# Patient Record
Sex: Female | Born: 1980 | Hispanic: Yes | Marital: Single | Smoking: Never smoker
Health system: Southern US, Community
[De-identification: ages and names within clinical notes are randomized; demographics above are authoritative.]

## PROBLEM LIST (undated history)

## (undated) DIAGNOSIS — E119 Type 2 diabetes mellitus without complications: Secondary | ICD-10-CM

---

## 2011-12-17 ENCOUNTER — Emergency Department: Payer: Self-pay | Admitting: Emergency Medicine

## 2012-02-28 LAB — HM PAP SMEAR: HM Pap smear: NEGATIVE

## 2012-03-31 ENCOUNTER — Emergency Department: Payer: Self-pay | Admitting: Emergency Medicine

## 2012-03-31 LAB — CBC
HGB: 13.6 g/dL (ref 12.0–16.0)
Platelet: 237 10*3/uL (ref 150–440)
RBC: 4.66 10*6/uL (ref 3.80–5.20)
RDW: 13.8 % (ref 11.5–14.5)

## 2012-04-02 ENCOUNTER — Other Ambulatory Visit: Payer: Self-pay | Admitting: Emergency Medicine

## 2012-04-02 LAB — HCG, QUANTITATIVE, PREGNANCY: Beta Hcg, Quant.: 115 m[IU]/mL — ABNORMAL HIGH

## 2012-08-04 ENCOUNTER — Ambulatory Visit: Payer: Self-pay | Admitting: Family Medicine

## 2012-08-21 ENCOUNTER — Emergency Department: Payer: Self-pay | Admitting: Emergency Medicine

## 2012-08-21 LAB — CBC
HCT: 37.1 % (ref 35.0–47.0)
MCHC: 34.8 g/dL (ref 32.0–36.0)
Platelet: 249 10*3/uL (ref 150–440)
RBC: 4.33 10*6/uL (ref 3.80–5.20)
RDW: 13.5 % (ref 11.5–14.5)

## 2012-08-21 LAB — COMPREHENSIVE METABOLIC PANEL
Albumin: 3.9 g/dL (ref 3.4–5.0)
Calcium, Total: 9.2 mg/dL (ref 8.5–10.1)
Chloride: 108 mmol/L — ABNORMAL HIGH (ref 98–107)
Creatinine: 0.72 mg/dL (ref 0.60–1.30)
EGFR (African American): >60
EGFR (Non-African Amer.): >60
Glucose: 123 mg/dL — ABNORMAL HIGH (ref 65–99)
Osmolality: 278 (ref 275–301)
SGOT(AST): 26 U/L (ref 15–37)
Sodium: 139 mmol/L (ref 136–145)
Total Protein: 7.5 g/dL (ref 6.4–8.2)

## 2012-08-21 LAB — HEMOGLOBIN: HGB: 12.7 g/dL (ref 12.0–16.0)

## 2013-04-30 ENCOUNTER — Encounter (HOSPITAL_BASED_OUTPATIENT_CLINIC_OR_DEPARTMENT_OTHER): Payer: Self-pay | Admitting: MS"

## 2013-05-01 ENCOUNTER — Encounter (HOSPITAL_BASED_OUTPATIENT_CLINIC_OR_DEPARTMENT_OTHER): Payer: Self-pay | Admitting: MS"

## 2013-05-01 DIAGNOSIS — O289 Unspecified abnormal findings on antenatal screening of mother: Secondary | ICD-10-CM

## 2013-05-01 DIAGNOSIS — Z8279 Family history of other congenital malformations, deformations and chromosomal abnormalities: Secondary | ICD-10-CM

## 2013-05-01 NOTE — Progress Notes (Signed)
[facility]    GENETIC CONSULTATION SUMMARY    REFERRAL:  Isabella Turner was seen at the [facility] in [place] on 04/30/2013 due to a positive serum integrated screen: Down syndrome.    SIGNIFICANT FAMILY AND PREGNANCY HISTORY:  Isabella Turner is a 33 year old Hispanic woman seen for genetic consultation.  Estimated Date of Delivery: 10/05/2013. Father of the baby's age is <45 and he is Hispanic.    The patient reports her brother was born with a "hole" in his heart.  He reportedly has no other medical or developmental concerns.  Records were not available for review at the time of genetic counseling.  Congenital heart defects (CHD) can occur in isolation or as a part of broader pattern of malformation. They can vary significantly in severity, even within the same family. Recurrence risks for CHD vary depending on the precise cause, the location and type of defect, and the degree of relation of the affected individual(s) to a pregnancy. Depending on the heart defect involved, a fetal echocardiogram may be indicated at approximately [redacted] weeks gestation.    Family history was further reviewed by genetic screening questionnaire and by genetic pedigree assessment.  As reported, family history is otherwise negative for birth defects, intellectual disability, and genetic disorders.    EXPOSURE HISTORY:  Unknown muscle relaxer taken once in early pregnancy.  Patient requested exposure information and was provided the Mother to [facility] ([email or web address]) phone number: [phone].     RISK ASSESSMENT:  The following risks were given to this patient for this pregnancy:   -Based on age at [facility] of 71:  Down Syndrome 1:365, Trisomy 41: 1:1422, any aneuploidy: 1:172  -Based on Serum Integrated Screen: Down syndrome: POSITIVE 1/95, Trisomy 18: 1/2,200, SCD (Smith-Lemli-Opitz, Congenital anomalies, Fetal Demise): <1/10,000  -ONTD Risk in the general  population 1/1,000  -General population risk of birth defects (3-5%)    BENEFITS AND LIMITATIONS OF THE FOLLOWING GENETIC TESTING WAS DISCUSSED:  Serum Integrated Screen  Chromosome Analysis  Cystic Fibrosis carrier screening  Testing of cell free fetal DNA from maternal blood  Expanded carrier screening (Counsyl screen)  Chromosomal Microarray -  a technology that elevates the chromosome structure at a finer resolution than traditional karyotype method    THE TIMING, RISKS, BENEFITS AND LIMITATIONS OF THE FOLLOWING PROCEDURES WAS REVIEWED:  - Ultrasound  - Amniocentesis (miscarriage rate is approximately 1/400 for our high risk patient population)    COUNSELING COMMENTS:  We reviewed the benefits and limitations of noninvasive prenatal testing (NIPT).  NIPT is performed by [facility] and is called "InformaSeq".  Analysis of cell-free fetal DNA extracted from a maternal blood sample is assessed for increased amounts of cffDNA derived from chromosomes 21, 18, 13, X and Y.  Clinical validation has shown a high detection rate for the most common chromosome abnormalities (~99% for Down syndrome, ~97% for trisomy 18, and ~87% for trisomy 13) with false positive rates lower than 0.7%.  However, this is still a SCREENING TEST and an amniocentesis would still be needed to confirm a positive or negative result. (Genet Med. 2011; 13(11):913-920; Genet Med. 2012; 14(3):296-305; Prenat Diagn. 2013; 33(6):591-597). Patient consented to testing today.  We will contact her with results when they become available in approximately 2 weeks.    Cystic fibrosis was discussed including its natural history and autosomal recessive inheritance.  If both parents are carriers, there would be a  25% risk in each pregnancy to have an affected child.  Carrier testing is available and prenatal testing may be available if both parents are carriers.     We discussed the benefits and limitations of expanded carrier screening. This method  has the advantage of screening for 80+ genetic conditions, including Cystic Fibrosis and Fragile X syndrome. Detection rates vary among different populations. While a negative result does not completely eliminate the risk of being a carrier for these conditions, it is able to diminish the risk. Testing declined today.    THE FOLLOWING ULTRASOUND FINDINGS WERE DISCUSSED:  Ultrasound was limited by maternal body habitus.  No major fetal anomalies seen. See ultrasound report to follow.    PLAN:  Patient chose: Ultrasound and NIPT.  Patient declined:Amniocentesis.    SUGGESTIONS FOR FURTHER EVALUATION:  Cystic Fibrosis carrier screening for patient/FOB if desired  Followup ultrasound is suggested for: 4 weeks to clear poorly seen anatomy on today's scan    Total time spent face to face counseling was 30 minutes. Additional time spent reviewing records, charting, co-ordination of care was 15 minutes.    Please call our office at [phone] if you have questions.  You will be notified if new information becomes available upon review of this case by the medical geneticist.    Thank you for this referral.    Sincerely,  Rosemary Holmseborah Barragan, MS, [facility]  Licensed DentistGenetic Counselor

## 2013-05-11 ENCOUNTER — Telehealth (HOSPITAL_BASED_OUTPATIENT_CLINIC_OR_DEPARTMENT_OTHER): Payer: Self-pay | Admitting: MS"

## 2013-05-11 DIAGNOSIS — O289 Unspecified abnormal findings on antenatal screening of mother: Principal | ICD-10-CM

## 2013-05-12 NOTE — Telephone Encounter (Signed)
I informed the patient that her InformaSeq results indicated: No Aneuploidy Detected. No increased amount of chromosome 21, 18, and 13 was detected. This greatly reduces the likelihood of Down syndrome, trisomy 3218, and trisomy 5813. This test is expected to have a 99.9% sensitivity and 99.8% specificity for Down syndrome. The expected sensitivity for trisomy 4618 and trisomy 13 is 97.4% and 87.5%, respectively. No Y chromosomal material was detected indicating a female fetus with 98.9% accuracy. However, this is still a SCREENING TEST and an amniocentesis would still be needed to confirm a positive or negative result. (Genet Med. 2011;13(11):913-920; Genet Med. 2012;14(3):296-305; Prenat Diagn. 2013;33(6):591-597).     The report was faxed to your office. If you did not receive a copy or have any questions, please call [facility] at [phone], option 2.    Rosemary Holmseborah Barragan, MS, [facility]  Genetic Counselor

## 2014-06-26 ENCOUNTER — Encounter (INDEPENDENT_AMBULATORY_CARE_PROVIDER_SITE_OTHER): Payer: Self-pay

## 2017-05-01 DIAGNOSIS — R7989 Other specified abnormal findings of blood chemistry: Secondary | ICD-10-CM | POA: Insufficient documentation

## 2017-10-21 DIAGNOSIS — E663 Overweight: Secondary | ICD-10-CM | POA: Insufficient documentation

## 2018-03-19 LAB — HM HIV SCREENING LAB: HM HIV Screening: NEGATIVE

## 2018-08-25 ENCOUNTER — Other Ambulatory Visit: Payer: Self-pay

## 2018-08-25 ENCOUNTER — Emergency Department
Admission: EM | Admit: 2018-08-25 | Discharge: 2018-08-25 | Disposition: A | Discharge status: Home / Self Care | Payer: Self-pay | Attending: Emergency Medicine | Admitting: Emergency Medicine

## 2018-08-25 ENCOUNTER — Encounter: Payer: Self-pay | Admitting: *Deleted

## 2018-08-25 DIAGNOSIS — R51 Headache: Secondary | ICD-10-CM | POA: Insufficient documentation

## 2018-08-25 DIAGNOSIS — Z79899 Other long term (current) drug therapy: Secondary | ICD-10-CM | POA: Insufficient documentation

## 2018-08-25 DIAGNOSIS — Z794 Long term (current) use of insulin: Secondary | ICD-10-CM | POA: Insufficient documentation

## 2018-08-25 DIAGNOSIS — B309 Viral conjunctivitis, unspecified: Secondary | ICD-10-CM | POA: Insufficient documentation

## 2018-08-25 MED ORDER — TETRACAINE HCL 0.5 % OP SOLN
2.0000 [drp] | Freq: Once | OPHTHALMIC | Status: AC
  Administered 2018-08-25: 2 [drp] via OPHTHALMIC
  Filled 2018-08-25: qty 4

## 2018-08-25 MED ORDER — OLOPATADINE HCL 0.1 % OP SOLN: 1.0000 [drp] | Freq: Two times a day (BID) | OPHTHALMIC | 1 refills | Status: DC

## 2018-08-25 MED ORDER — TRAMADOL HCL 50 MG PO TABS: 50.0000 mg | ORAL_TABLET | Freq: Four times a day (QID) | ORAL | 0 refills | Status: DC | PRN

## 2018-08-25 MED ORDER — EYE WASH OPHTH SOLN
2.0000 [drp] | OPHTHALMIC | Status: DC | PRN
  Administered 2018-08-25: 2 [drp] via OPHTHALMIC
  Filled 2018-08-25: qty 118

## 2018-08-25 NOTE — ED Triage Notes (Signed)
Pt has right eye redness and pain.  Sx for 1 day.  No known injury to eye.  Pt alert.  Interpreter with pt

## 2018-08-25 NOTE — ED Provider Notes (Signed)
[facility] Emergency Department Provider Note   ____________________________________________   First MD Initiated Contact with Patient 08/25/18 1658     (approximate)  I have reviewed the triage vital signs and the nursing notes.   HISTORY  Chief Complaint Eye Pain   Via interpreter HPI Bethany Ramirez is a 38 y.o. female patient presents with right eye redness and pain.  Incident occurred today.  Patient stated no provocative incident.  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  Patient states she is slightly photophobic and has a headache after developing eye pain.  Patient denies any other vision discomfort.  Patient did not wear contact lenses.  Patient denies drainage.  Patient rates the pain as a 5/10.  Patient scribed pain is "achy".  No palliative measure for complaint.     No past medical history on file.  There are no active problems to display for this patient.     Prior to Admission medications   Medication Sig Start Date End Date Taking? Authorizing Provider  insulin aspart (NOVOLOG) 100 UNIT/ML injection Inject into the skin 3 (three) times daily before meals.   Yes [provider]  NIFEdipine (PROCARDIA XL/NIFEDICAL-XL) 90 MG 24 hr tablet Take 90 mg by mouth daily.   Yes [provider]  olopatadine (PATANOL) 0.1 % ophthalmic solution Place 1 drop into both eyes 2 (two) times daily. 08/25/18 08/25/19  Joni ReiningSmith, Ronald K, PA-C  traMADol (ULTRAM) 50 MG tablet Take 1 tablet (50 mg total) by mouth every 6 (six) hours as needed. 08/25/18   Joni ReiningSmith, Ronald K, PA-C    Allergies Patient has no known allergies.  No family history on file.  Social History Social History   Tobacco Use  . Smoking status: Never Smoker  . Smokeless tobacco: Never Used  Substance Use Topics  . Alcohol use: Not Currently  . Drug use: Not Currently    Review of Systems Constitutional:  No fever/chills Eyes: Mild right eye pain and mild sun sensitivity.   ENT: No sore throat. Cardiovascular: Denies chest pain. Respiratory: Denies shortness of breath. Gastrointestinal: No abdominal pain.  No nausea, no vomiting.  No diarrhea.  No constipation. Genitourinary: Negative for dysuria. Musculoskeletal: Negative for back pain. Skin: Negative for rash. Neurological: Negative for headaches, focal weakness or numbness. Hematological/Lymphatic:  Allergic/Immunilogical: **}  ____________________________________________   PHYSICAL EXAM:  VITAL SIGNS: ED Triage Vitals [08/25/18 1652]  Enc Vitals Group     BP (!) 126/93     Pulse Rate (!) 103     Resp 20     Temp 98.6 F (37 C)     Temp Source Oral     SpO2 99 %     Weight      Height      Head Circumference      Peak Flow      Pain Score 5     Pain Loc      Pain Edu?      Excl. in GC?     Constitutional: Alert and oriented. Well appearing and in no acute distress. Eyes: Conjunctivae are erythematous l. PERRL. EOMI. no foreign body.  No drainage. Cardiovascular: Normal rate, regular rhythm. Grossly normal heart sounds.  Good peripheral circulation. Respiratory: Normal respiratory effort.  No retractions. Lungs CTAB. Neurologic:  Normal speech and language. No gross focal neurologic deficits are appreciated. No gait instability. Skin:  Skin is warm, dry and  intact. No rash noted. Psychiatric: Mood and affect are normal. Speech and behavior are normal.  ____________________________________________   LABS (all labs ordered are listed, but only abnormal results are displayed)  Labs Reviewed - No data to display ____________________________________________  EKG   ____________________________________________  RADIOLOGY  ED MD interpretation:    Official radiology report(s): No results found.  ____________________________________________   PROCEDURES  Procedure(s) performed (including Critical Care):   Procedures   ____________________________________________   INITIAL IMPRESSION / ASSESSMENT AND PLAN / ED COURSE  As part of my medical decision making, I reviewed the following data within the [place]         Patient presents with 1 day of eye redness and pain.  Patient also complain of headache since developing the eye pain.  Fluoroscopy stain reveals no lesions.  Patient physical exam consistent with conjunctivitis.  Patient given discharge care instruction advised use eyedrops as directed.  Advised to follow-up with the ophthalmologist if no improvement in 3 days.     ____________________________________________   FINAL CLINICAL IMPRESSION(S) / ED DIAGNOSES  Final diagnoses:  Acute viral conjunctivitis of right eye     ED Discharge Orders         Ordered    olopatadine (PATANOL) 0.1 % ophthalmic solution  2 times daily     08/25/18 1730    traMADol (ULTRAM) 50 MG tablet  Every 6 hours PRN     08/25/18 1730           Note:  This document was prepared using Dragon voice recognition software and may include unintentional dictation errors.    Sable Feil, PA-C 08/25/18 1753    Arta Silence, MD 08/25/18 2253

## 2018-08-25 NOTE — ED Notes (Signed)
See triage note  Presents with irritation to right eye 

## 2018-11-10 ENCOUNTER — Other Ambulatory Visit: Payer: Self-pay

## 2018-11-10 DIAGNOSIS — S04041A Injury of visual cortex, right eye, initial encounter: Secondary | ICD-10-CM | POA: Diagnosis present

## 2018-11-10 DIAGNOSIS — Y990 Civilian activity done for income or pay: Secondary | ICD-10-CM | POA: Insufficient documentation

## 2018-11-10 DIAGNOSIS — Y33XXXA Other specified events, undetermined intent, initial encounter: Secondary | ICD-10-CM | POA: Insufficient documentation

## 2018-11-10 DIAGNOSIS — H1033 Unspecified acute conjunctivitis, bilateral: Secondary | ICD-10-CM | POA: Insufficient documentation

## 2018-11-10 DIAGNOSIS — S0501XA Injury of conjunctiva and corneal abrasion without foreign body, right eye, initial encounter: Secondary | ICD-10-CM | POA: Insufficient documentation

## 2018-11-10 DIAGNOSIS — Y9389 Activity, other specified: Secondary | ICD-10-CM | POA: Insufficient documentation

## 2018-11-10 DIAGNOSIS — Y929 Unspecified place or not applicable: Secondary | ICD-10-CM | POA: Diagnosis not present

## 2018-11-10 NOTE — ED Triage Notes (Addendum)
Pt was working when some sort of powder from metal equipment fell onto eyes, no co eye redness and irritation. Pt did rinse her eyes at work.

## 2018-11-11 ENCOUNTER — Emergency Department
Admission: EM | Admit: 2018-11-11 | Discharge: 2018-11-11 | Disposition: A | Discharge status: Home / Self Care | Attending: Emergency Medicine | Admitting: Emergency Medicine

## 2018-11-11 DIAGNOSIS — S0501XA Injury of conjunctiva and corneal abrasion without foreign body, right eye, initial encounter: Secondary | ICD-10-CM

## 2018-11-11 DIAGNOSIS — H1033 Unspecified acute conjunctivitis, bilateral: Secondary | ICD-10-CM

## 2018-11-11 MED ORDER — FLUORESCEIN SODIUM 1 MG OP STRP
ORAL_STRIP | OPHTHALMIC | Status: AC
  Filled 2018-11-11: qty 1

## 2018-11-11 MED ORDER — FLUORESCEIN SODIUM 1 MG OP STRP
1.0000 | ORAL_STRIP | Freq: Once | OPHTHALMIC | Status: AC
  Administered 2018-11-11: 1 via OPHTHALMIC

## 2018-11-11 MED ORDER — ERYTHROMYCIN 5 MG/GM OP OINT
TOPICAL_OINTMENT | Freq: Once | OPHTHALMIC | Status: AC
  Administered 2018-11-11: 1 via OPHTHALMIC
  Filled 2018-11-11 (×2): qty 1

## 2018-11-11 MED ORDER — TETRACAINE HCL 0.5 % OP SOLN
1.0000 [drp] | Freq: Once | OPHTHALMIC | Status: AC
  Administered 2018-11-11: 02:00:00 1 [drp] via OPHTHALMIC
  Filled 2018-11-11 (×2): qty 4

## 2018-11-11 MED ORDER — ERYTHROMYCIN 5 MG/GM OP OINT: 1.0000 "application " | TOPICAL_OINTMENT | Freq: Three times a day (TID) | OPHTHALMIC | 0 refills | Status: AC

## 2018-11-11 NOTE — ED Provider Notes (Signed)
[facility] Emergency Department Provider Note  ____________________________________________  Time seen: Approximately 1:52 AM  I have reviewed the triage vital signs and the nursing notes.   HISTORY  Chief Complaint Eye Injury   HPI Bethany Ramirez is a 38 y.o. female who presents for evaluation of bilateral eye pain.  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  She was reaching out to grab a container when some dust from a machine fell into her eye.  She is complaining of burning bilateral eyes.  She washed it at the factory.  She denies any chemicals following her eye.  She denies any changes in vision, pain with extraocular movements or headache.   She reports wearing goggles however the goggles were not sufficient to protect this injury.  She does not wear contact lenses.  PMH None - reviewed  Prior to Admission medications   Medication Sig Start Date End Date Taking? Authorizing Provider  erythromycin ophthalmic ointment Place 1 application into both eyes 3 (three) times daily for 3 days. 11/11/18 11/14/18  Nita SickleVeronese, Ettrick, MD  insulin aspart (NOVOLOG) 100 UNIT/ML injection Inject into the skin 3 (three) times daily before meals.    [provider]  NIFEdipine (PROCARDIA XL/NIFEDICAL-XL) 90 MG 24 hr tablet Take 90 mg by mouth daily.    [provider]  olopatadine (PATANOL) 0.1 % ophthalmic solution Place 1 drop into both eyes 2 (two) times daily. 08/25/18 08/25/19  Joni ReiningSmith, Ronald K, PA-C  traMADol (ULTRAM) 50 MG tablet Take 1 tablet (50 mg total) by mouth every 6 (six) hours as needed. 08/25/18   Joni ReiningSmith, Ronald K, PA-C    Allergies Patient has no known allergies.  No family history on file.  Social History Social History   Tobacco Use  . Smoking status: Never Smoker  . Smokeless tobacco: Never Used  Substance Use Topics  . Alcohol use: Not Currently  . Drug use: Not Currently    Review of Systems  Constitutional: Negative for  fever. Eyes: Negative for visual changes. + b/l eye pain and irritation ENT: Negative for sore throat. Neck: No neck pain  Cardiovascular: Negative for chest pain. Respiratory: Negative for shortness of breath. Gastrointestinal: Negative for abdominal pain, vomiting or diarrhea. Genitourinary: Negative for dysuria. Musculoskeletal: Negative for back pain. Skin: Negative for rash. Neurological: Negative for headaches, weakness or numbness. Psych: No SI or HI  ____________________________________________   PHYSICAL EXAM:  VITAL SIGNS: ED Triage Vitals [11/10/18 2355]  Enc Vitals Group     BP 125/80     Pulse Rate 96     Resp 20     Temp 98.2 F (36.8 C)     Temp Source Oral     SpO2 99 %     Weight 176 lb (79.8 kg)     Height      Head Circumference      Peak Flow      Pain Score 0     Pain Loc      Pain Edu?      Excl. in GC?     Constitutional: Alert and oriented. Well appearing and in no apparent distress. HEENT:      Head: Normocephalic and atraumatic.         Eyes: Conjunctivae are injected and erythematous bilaterally, PERRL, EOMI, no foreign bodies, small corneal abrasion bilaterally, normal visual acuity      Mouth/Throat: Mucous membranes are moist.       Neck: Supple with no signs  of meningismus. Cardiovascular: Regular rate and rhythm.  Respiratory: Normal respiratory effort.  Musculoskeletal: Nontender with normal range of motion in all extremities. Neurologic: Normal speech and language. Face is symmetric. Moving all extremities. No gross focal neurologic deficits are appreciated. Skin: Skin is warm, dry and intact. No rash noted. Psychiatric: Mood and affect are normal. Speech and behavior are normal.  ____________________________________________   LABS (all labs ordered are listed, but only abnormal results are displayed)  Labs Reviewed - No data to display ____________________________________________  EKG  none   ____________________________________________  RADIOLOGY  none  ____________________________________________   PROCEDURES  Procedure(s) performed: None Procedures Critical Care performed:  None ____________________________________________   INITIAL IMPRESSION / ASSESSMENT AND PLAN / ED COURSE  38 y.o. female who presents for evaluation of bilateral eye pain after a dust from a machine fell in her eyes at work.  Patient's eyes were washed thoroughly in our eye decontamination sink.  Tetracaine was placed for pain.  Fluorescein evaluation under Woods lamp revealed a very small corneal abrasion in each eye therefore patient was started on erythromycin ointment.  No foreign bodies.  Discussed the importance of wearing bigger goggles to prevent this type of injury from happening.  Patient will be referred to ophthalmology for follow-up.       As part of my medical decision making, I reviewed the following data within the [place] notes reviewed and incorporated, Old chart reviewed, Notes from prior ED visits and [place] Controlled Substance Database   Patient was evaluated in Emergency Department today for the symptoms described in the history of present illness. Patient was evaluated in the context of the global COVID-19 pandemic, which necessitated consideration that the patient might be at risk for infection with the SARS-CoV-2 virus that causes COVID-19. Institutional protocols and algorithms that pertain to the evaluation of patients at risk for COVID-19 are in a state of rapid change based on information released by regulatory bodies including the CDC and federal and state organizations. These policies and algorithms were followed during the patient's care in the ED.   ____________________________________________   FINAL CLINICAL IMPRESSION(S) / ED DIAGNOSES   Final diagnoses:  Abrasion of right cornea, initial encounter  Acute conjunctivitis of both eyes,  unspecified acute conjunctivitis type      NEW MEDICATIONS STARTED DURING THIS VISIT:  ED Discharge Orders         Ordered    erythromycin ophthalmic ointment  3 times daily     11/11/18 0152           Note:  This document was prepared using Dragon voice recognition software and may include unintentional dictation errors.    Alfred Levins, [place], MD 11/11/18 [phone]

## 2018-11-11 NOTE — ED Notes (Signed)
Pt at the eye wash station now with md.

## 2018-11-11 NOTE — ED Notes (Signed)
Pt employer requests a UDS for worker's comp.  Consents signed, specimen obtained and released to lab via chain of custody protocol.  Specimen ID no [id number]

## 2018-12-12 ENCOUNTER — Ambulatory Visit (LOCAL_COMMUNITY_HEALTH_CENTER): Payer: Self-pay | Admitting: Physician Assistant

## 2018-12-12 ENCOUNTER — Other Ambulatory Visit: Payer: Self-pay

## 2018-12-12 VITALS — BP 126/77 | Ht 61.0 in | Wt 164.0 lb

## 2018-12-12 DIAGNOSIS — Z3009 Encounter for other general counseling and advice on contraception: Secondary | ICD-10-CM

## 2018-12-12 DIAGNOSIS — B3731 Acute candidiasis of vulva and vagina: Secondary | ICD-10-CM

## 2018-12-12 DIAGNOSIS — Z113 Encounter for screening for infections with a predominantly sexual mode of transmission: Secondary | ICD-10-CM

## 2018-12-12 DIAGNOSIS — B373 Candidiasis of vulva and vagina: Secondary | ICD-10-CM

## 2018-12-12 DIAGNOSIS — Z30013 Encounter for initial prescription of injectable contraceptive: Secondary | ICD-10-CM

## 2018-12-12 LAB — WET PREP FOR TRICH, YEAST, CLUE
Trichomonas Exam: NEGATIVE
Yeast Exam: NEGATIVE

## 2018-12-12 MED ORDER — CLOTRIMAZOLE 1 % VA CREA: 1.0000 | TOPICAL_CREAM | Freq: Every day | VAGINAL | 0 refills | Status: AC

## 2018-12-12 MED ORDER — MEDROXYPROGESTERONE ACETATE 150 MG/ML IM SUSP
150.0000 mg | INTRAMUSCULAR | Status: DC
  Administered 2018-12-12: 12:00:00 150 mg via INTRAMUSCULAR

## 2018-12-12 NOTE — Progress Notes (Signed)
Pt received Depo 150mg  IM today per Antoine Primas, PA order. Pt tolerated well. Wet mount reviewed with provider. Pt received Clotrimazole Vaginal cream per Antoine Primas, PA verbal order. Provider orders completed.Ronny Bacon, RN

## 2018-12-12 NOTE — Progress Notes (Signed)
In to start Depo; no BCM x last 3 months Debera Lat, RN

## 2018-12-13 ENCOUNTER — Encounter: Payer: Self-pay | Admitting: Physician Assistant

## 2018-12-13 NOTE — Progress Notes (Signed)
[facility] problem [facility]  Subjective:  Bethany Ramirez is a 38 y.o. being seen today for starting on Depo.  Chief Complaint  Patient presents with  . Contraception    HPI  Patient into clinic requesting to start Depo as BCM.  States that she has not used Depo in the past but has read and understood the written info she has been given.  States that she has also had some vaginal itching and would like a screening today.    Does the patient have a current or past history of drug use? No   No components found for: HCV]   Health Maintenance Due  Topic Date Due  . URINE MICROALBUMIN  02/08/1989  . PAP SMEAR-Modifier  02/28/2015  . INFLUENZA VACCINE  09/27/2018    Review of Systems  All other systems reviewed and are negative.   The following portions of the patient's history were reviewed and updated as appropriate: allergies, current medications, past family history, past medical history, past social history, past surgical history and problem list. Problem list updated.   See flowsheet for other program required questions.  Objective:   Vitals:   12/12/18 1058  BP: 126/77  Weight: 164 lb (74.4 kg)  Height: 5\' 1"  (1.549 m)    Physical Exam Vitals signs reviewed.  Constitutional:      General: She is not in acute distress.    Appearance: Normal appearance.  HENT:     Head: Normocephalic and atraumatic.     Mouth/Throat:     Mouth: Mucous membranes are moist.     Pharynx: Oropharynx is clear. No oropharyngeal exudate or posterior oropharyngeal erythema.  Neck:     Musculoskeletal: Neck supple.  Pulmonary:     Effort: Pulmonary effort is normal.  Abdominal:     Palpations: Abdomen is soft. There is no mass.     Tenderness: There is no abdominal tenderness. There is no guarding or rebound.  Genitourinary:    General: Normal vulva.     Rectum: Normal.     Comments: External genitalia/pubic area without  nits, lice, lesions and inguinal adenopathy. External labia bilaterally with edema and erythema with scaling, nt, no lesions. Vagina with normal mucosa and small amount of white discharge. Cervix without visible lesions.  Uterus firm, mobile, nt, no masses, no CMT, no adnexal tenderness or fullness. Lymphadenopathy:     Cervical: No cervical adenopathy.  Skin:    General: Skin is warm and dry.     Findings: No bruising, erythema, lesion or rash.  Neurological:     Mental Status: She is alert and oriented to person, place, and time.  Psychiatric:        Mood and Affect: Mood normal.        Behavior: Behavior normal.        Thought Content: Thought content normal.        Judgment: Judgment normal.       Assessment and Plan:  Bethany Ramirez is a 38 y.o. female presenting to the [facility] Department for a Women's Health problem visit  1. Encounter for counseling regarding contraception Counseled patient on risks, benefits, and SE of Depo . Counseled to use condoms with all sex for STD protection and especially for 2 weeks after shot today. Counseled patient re:  Risk of pregnancy with unprotected sex less than 2 weeks ago and that patient should check a pregnancy test in 2  weeks at home and RTC if it is positive. Counseled patient when to call clinic for irregular bleeding. - medroxyPROGESTERone (DEPO-PROVERA) injection 150 mg  2. Initiation of Depo Provera OK for Depo 150mg  IM q 11-13 weeks x 3, until RP due in Feb/Mar of 2021. At next appt, patient check to see if patient did a pregnancy test at home 2 weeks after today's appt and if not, needs negative pregnancy test prior to next shot.   - medroxyPROGESTERone (DEPO-PROVERA) injection 150 mg  3. Candidal vulvovaginitis Treat for yeast given exam findings with Clotrimazole 1% vaginal cream 1 app qhs for 7 days. No sex for 7 days. - clotrimazole (CLOTRIMAZOLE-7) 1 % vaginal cream; Place 1 Applicatorful  vaginally at bedtime for 7 days.  Dispense: 45 g; Refill: 0  4. Screening for STD (sexually transmitted disease) Await test results from other testing. Counseled that RN will call if needs to RTC for further treatment once results are back. - WET PREP FOR [place], YEAST, [place] [facility] - Syphilis Serology, [place] [facility]     Return in about 11 weeks (around 02/27/2019) for physical and depo.  No future appointments.  Jerene Dilling, PA

## 2019-04-06 ENCOUNTER — Ambulatory Visit (LOCAL_COMMUNITY_HEALTH_CENTER): Payer: Self-pay | Admitting: Family Medicine

## 2019-04-06 ENCOUNTER — Other Ambulatory Visit: Payer: Self-pay

## 2019-04-06 ENCOUNTER — Encounter: Payer: Self-pay | Admitting: Family Medicine

## 2019-04-06 VITALS — BP 112/77 | Ht 60.18 in | Wt 163.4 lb

## 2019-04-06 DIAGNOSIS — N898 Other specified noninflammatory disorders of vagina: Secondary | ICD-10-CM

## 2019-04-06 DIAGNOSIS — Z803 Family history of malignant neoplasm of breast: Secondary | ICD-10-CM

## 2019-04-06 DIAGNOSIS — Z3009 Encounter for other general counseling and advice on contraception: Secondary | ICD-10-CM

## 2019-04-06 LAB — WET PREP FOR TRICH, YEAST, CLUE: Trichomonas Exam: NEGATIVE

## 2019-04-06 MED ORDER — CLOTRIMAZOLE 1 % EX CREA: 1.0000 "application " | TOPICAL_CREAM | Freq: Two times a day (BID) | CUTANEOUS | 0 refills | Status: AC

## 2019-04-06 MED ORDER — MEDROXYPROGESTERONE ACETATE 150 MG/ML IM SUSP
150.0000 mg | INTRAMUSCULAR | Status: AC
  Administered 2019-04-06: 12:00:00 150 mg via INTRAMUSCULAR

## 2019-04-06 NOTE — Progress Notes (Signed)
Wet mount reviewed, no tx per standing order for wet mount. Pt provided [facility]CP brochure with local phone number. Provider orders completed.

## 2019-04-06 NOTE — Progress Notes (Signed)
Family Planning Visit  Subjective:  Bethany Ramirez is a 39 y.o. being seen today for  Chief Complaint  Patient presents with  . Contraception    depo (in arm)    Pt has Elevated LFTs and Overweight on their problem list.  HPI  Patient reports she is here for depo. She is [redacted]w[redacted]d post last injection.   She also endorses lots of vaginal itching, denies vaginal discharge.    Patient's last menstrual period was 03/06/2019 (approximate). Last sex: yesterday, without condom BCM: Depo Pt desires EC? N/a - <[redacted]w[redacted]d post last injection  Mammogram: never had one. Mother with breast cancer, dx age 17.  Last pap: 02/28/2012, result NIL, HPV negative  Patient reports 1 partner(s) in last year. Do they desire STI screening (if no, why not)? No, declines  Does the patient desire a pregnancy in the next year? no   39 y.o., Body mass index is 31.72 kg/m. - Is patient eligible for HA1C diabetes screening based on BMI and age >53?  Yes. She states she thinks she has diabetes, was taking insulin, though hasn't seen a provider since her daughter was born 37 months ago. She clarified this was not gestational DM, but that she believes she had it prior to pregnancy. Insulin is listed on her medication list but she states she's not taking it.   Does the patient have a current or past history of drug use? No         No components found for: HCV  See flowsheet for other program required questions.   Health Maintenance Due  Topic Date Due  . URINE MICROALBUMIN  02/08/1989  . PAP SMEAR-Modifier  02/28/2015  . INFLUENZA VACCINE  09/27/2018    ROS  The following portions of the patient's history were reviewed and updated as appropriate: allergies, current medications, past family history, past medical history, past social history, past surgical history and problem list. Problem list updated.  Objective:  BP 112/77   Ht 5' 0.18" (1.529 m)   Wt 163 lb 6.4 oz (74.1 kg)   LMP 03/06/2019  (Approximate) Comment: abnormal  BMI 31.72 kg/m    Physical Exam Vitals and nursing note reviewed.  Constitutional:      Appearance: Normal appearance.  HENT:     Head: Normocephalic and atraumatic.  Pulmonary:     Effort: Pulmonary effort is normal.  Chest:     Breasts:        Right: Normal. No swelling, bleeding, inverted nipple, mass, nipple discharge, skin change or tenderness.        Left: Inverted nipple present. No swelling, bleeding, mass, nipple discharge, skin change or tenderness.  Abdominal:     General: Abdomen is flat.     Palpations: There is no mass.     Tenderness: There is no abdominal tenderness. There is no rebound.  Genitourinary:    Exam position: Lithotomy position.     Pubic Area: No rash or pubic lice.      Labia:        Right: No rash or lesion.        Left: No rash or lesion.      Vagina: Normal. No vaginal discharge, erythema, bleeding or lesions.     Cervix: No cervical motion tenderness, discharge, friability, lesion or erythema.     Uterus: Normal.      Adnexa: Right adnexa normal and left adnexa normal.     Rectum: Normal.     Comments: Inner labia  erythematous with faintly shiny, atrophic patches Lymphadenopathy:     Head:     Right side of head: No preauricular or posterior auricular adenopathy.     Left side of head: No preauricular or posterior auricular adenopathy.     Cervical: No cervical adenopathy.     Upper Body:     Right upper body: No supraclavicular or axillary adenopathy.     Left upper body: No supraclavicular or axillary adenopathy.     Lower Body: No right inguinal adenopathy. No left inguinal adenopathy.  Skin:    General: Skin is warm and dry.     Findings: No rash.  Neurological:     Mental Status: She is alert and oriented to person, place, and time.       Assessment and Plan:  Bethany Ramirez is a 39 y.o. female presenting to the [facility] Department for a well woman exam/family planning  visit  Contraception counseling: Reviewed all forms of birth control options in the tiered based approach including abstinence; over the counter/barrier methods; hormonal contraceptive medication including pill, patch, ring, injection, contraceptive implant; hormonal and nonhormonal IUDs; permanent sterilization options including vasectomy and the various tubal sterilization modalities. Risks, benefits, how to discontinue and typical effectiveness rates were reviewed.  Questions were answered.  Written information was also given to the patient to review.  Patient desires depo, this was prescribed for patient. She will follow up in  3 months for surveillance.  She was told to call with any further questions, or with any concerns about this method of contraception.  Emphasized use of condoms 100% of the time for STI prevention.  Emergency Contraception: Discussed with Dr. Alvester Morin, n/a d/t <[redacted]w[redacted]d from last injection.   1. Family planning services -Depo today. Counseling as above. -Pap today.  -Pt agrees to A1c check today, states she believes she has DM though hasn't seen MD [facility] or been taking medications for past year.   - Hgb A1c w/o eAG - medroxyPROGESTERone (DEPO-PROVERA) injection 150 mg - IGP, Aptima HPV  2. Vaginal itching -Pt given clotrimazol AF cream to try in case this is yeast, but exam also suspicious for lichen sclerosus. Advised if no resolution of symptoms with clotrimazole to f/u with PCP for further investigation of this.  - WET PREP FOR TRICH, YEAST, CLUE - clotrimazole (CLOTRIMAZOLE AF) 1 % cream; Apply 1 application topically 2 (two) times daily.  Dispense: 30 g; Refill: 0  3. Family history of breast cancer -Pt's mother with breast cancer. Discussed risks/benefits of early mammograms starting at age 5, pt would like to proceed w/mammogram. -Exam today with inverted L nipple, pt states this has always been present. No other abnormalities. - Ambulatory Referral to  [facility]    Return in about 11 weeks (around 06/22/2019) for depo.  No future appointments.  Ann Held, PA-C

## 2019-04-06 NOTE — Progress Notes (Signed)
Pt states she is here for depo and physical. Pt denies problems. Per Cent[facility] notes [facility] PAP due 2021; last physical at [facility] 04/22/2018. 16.3 weeks post depo today. Pt states she did a home UPT 2 weeks after starting depo and it was negative.

## 2019-04-07 LAB — HGB A1C W/O EAG: Hgb A1c MFr Bld: 12.6 % — ABNORMAL HIGH (ref 4.8–5.6)

## 2019-04-09 LAB — IGP, APTIMA HPV
HPV Aptima: NEGATIVE
PAP Smear Comment: 0

## 2019-04-13 ENCOUNTER — Telehealth: Payer: Self-pay

## 2019-04-13 NOTE — Telephone Encounter (Signed)
Attempted TC with patient. Patient answered but call ended.  Bethany Campbell, RN   Interpreter V. Olmedo  Attempted TC to pt and went to VM. Bethany Campbell, RN   TC from patient. Informed of abnormal Hgb A1c and importance of ASAP appt with [facility].  Edcuated patient re: Type 2 DM, dangers and management.  Patient instructed to call PCP today for first available appt. RN will fax patient's lab results to PCP. Patient verbalized understanding. Bethany Campbell, RN  V. Olmedo interpreter

## 2019-04-14 NOTE — Telephone Encounter (Signed)
Lab faxed to [facility] with confirmation. Richmond Campbell, RN

## 2019-10-20 ENCOUNTER — Other Ambulatory Visit: Payer: Self-pay

## 2019-10-20 ENCOUNTER — Emergency Department: Payer: No Typology Code available for payment source

## 2019-10-20 ENCOUNTER — Encounter: Payer: Self-pay | Admitting: Emergency Medicine

## 2019-10-20 DIAGNOSIS — S92424A Nondisplaced fracture of distal phalanx of right great toe, initial encounter for closed fracture: Secondary | ICD-10-CM | POA: Insufficient documentation

## 2019-10-20 DIAGNOSIS — E119 Type 2 diabetes mellitus without complications: Secondary | ICD-10-CM | POA: Diagnosis not present

## 2019-10-20 DIAGNOSIS — W208XXA Other cause of strike by thrown, projected or falling object, initial encounter: Secondary | ICD-10-CM | POA: Diagnosis not present

## 2019-10-20 DIAGNOSIS — Y990 Civilian activity done for income or pay: Secondary | ICD-10-CM | POA: Insufficient documentation

## 2019-10-20 DIAGNOSIS — Y9289 Other specified places as the place of occurrence of the external cause: Secondary | ICD-10-CM | POA: Diagnosis not present

## 2019-10-20 DIAGNOSIS — Y9389 Activity, other specified: Secondary | ICD-10-CM | POA: Insufficient documentation

## 2019-10-20 DIAGNOSIS — S99921A Unspecified injury of right foot, initial encounter: Secondary | ICD-10-CM | POA: Diagnosis present

## 2019-10-20 NOTE — ED Triage Notes (Signed)
Pt triaged with use of interpreter. Pt presents to ED with pain to her great toe on her left foot. A plastic bottle of cream fell while pt was at work and landed on her toe. Pt states the bottle "exploded" and now her toe is swollen and bruised. Pt states she is unable to move her toe.

## 2019-10-21 ENCOUNTER — Emergency Department
Admission: EM | Admit: 2019-10-21 | Discharge: 2019-10-21 | Disposition: A | Discharge status: Home / Self Care | Payer: No Typology Code available for payment source | Attending: Emergency Medicine | Admitting: Emergency Medicine

## 2019-10-21 DIAGNOSIS — S92424A Nondisplaced fracture of distal phalanx of right great toe, initial encounter for closed fracture: Secondary | ICD-10-CM

## 2019-10-21 HISTORY — DX: Type 2 diabetes mellitus without complications: E11.9

## 2019-10-21 MED ORDER — ACETAMINOPHEN 500 MG PO TABS
1000.0000 mg | ORAL_TABLET | Freq: Once | ORAL | Status: AC
  Administered 2019-10-21: 1000 mg via ORAL
  Filled 2019-10-21: qty 2

## 2019-10-21 NOTE — ED Provider Notes (Signed)
[facility] Emergency Department Provider Note   ____________________________________________   First MD Initiated Contact with Patient 10/21/19 [phone]     (approximate)  I have reviewed the triage vital signs and the nursing notes.   HISTORY  Chief Complaint Toe Pain    HPI Bethany Ramirez is a 39 y.o. female with past medical history of diabetes who presents to the ED complaining of toe pain.  Patient reports that she was at work earlier in the evening when a plastic bucket fell onto her right big toe.  She had immediate onset of pain with associated swelling and bruising.  She states it is now painful for her to put weight on that toe and that the swelling makes it difficult for her to put on her shoe.  She has not yet taken any medication for pain.        Past Medical History:  Diagnosis Date  . Diabetes mellitus without complication [facility])     Patient Active Problem List   Diagnosis Date Noted  . Overweight 10/21/2017  . Elevated LFTs 05/01/2017    Past Surgical History:  Procedure Laterality Date  . CESAREAN SECTION      Prior to Admission medications   Medication Sig Start Date End Date Taking? Authorizing Provider  acetaminophen (TYLENOL) 500 MG tablet Take 500 mg by mouth every 6 (six) hours as needed. PRN    [provider]  clotrimazole (CLOTRIMAZOLE AF) 1 % cream Apply 1 application topically 2 (two) times daily. 04/06/19   Staples, Hulda Humphrey, PA-C    Allergies Patient has no known allergies.  No family history on file.  Social History Social History   Tobacco Use  . Smoking status: Never Smoker  . Smokeless tobacco: Never Used  Vaping Use  . Vaping Use: Never used  Substance Use Topics  . Alcohol use: Not Currently  . Drug use: Not Currently    Review of Systems  Constitutional: No fever/chills Eyes: No visual changes. ENT: No sore throat. Cardiovascular: Denies chest pain. Respiratory: Denies  shortness of breath. Gastrointestinal: No abdominal pain.  No nausea, no vomiting.  No diarrhea.  No constipation. Genitourinary: Negative for dysuria. Musculoskeletal: Negative for back pain.  Positive for toe pain. Skin: Negative for rash. Neurological: Negative for headaches, focal weakness or numbness.  ____________________________________________   PHYSICAL EXAM:  VITAL SIGNS: ED Triage Vitals  Enc Vitals Group     BP 10/20/19 2155 130/79     Pulse Rate 10/20/19 2155 95     Resp 10/20/19 2155 18     Temp 10/20/19 2155 99.3 F (37.4 C)     Temp Source 10/20/19 2155 Oral     SpO2 10/20/19 2155 99 %     Weight 10/20/19 2201 155 lb 3.3 oz (70.4 kg)     Height --      Head Circumference --      Peak Flow --      Pain Score 10/20/19 2200 9     Pain Loc --      Pain Edu? --      Excl. in GC? --     Constitutional: Alert and oriented. Eyes: Conjunctivae are normal. Head: Atraumatic. Nose: No congestion/rhinnorhea. Mouth/Throat: Mucous membranes are moist. Neck: Normal ROM Cardiovascular: Normal rate, regular rhythm. Grossly normal heart sounds. Respiratory: Normal respiratory effort.  No retractions. Lungs CTAB. Gastrointestinal: Soft and nontender. No distention. Genitourinary: deferred Musculoskeletal: Ecchymosis and edema to right big toe with associated  tenderness to palpation, no apparent nailbed injury.  Otherwise no tenderness to right foot or ankle. Neurologic:  Normal speech and language. No gross focal neurologic deficits are appreciated. Skin:  Skin is warm, dry and intact. No rash noted. Psychiatric: Mood and affect are normal. Speech and behavior are normal.  ____________________________________________   LABS (all labs ordered are listed, but only abnormal results are displayed)  Labs Reviewed - No data to display   PROCEDURES  Procedure(s) performed (including Critical  Care):  Procedures   ____________________________________________   INITIAL IMPRESSION / ASSESSMENT AND PLAN / ED COURSE       39 year old female with past medical history of diabetes presents to the ED with pain and swelling of right big toe after a bucket full of cream fell onto it.  She has had significant swelling and ecchymosis since then and x-ray is concerning for nondisplaced tuft fracture.  There is no apparent nailbed injury and we will place dressing with buddy tape, place right foot in postop shoe.  Patient counseled to alternate Tylenol and ibuprofen for pain control and to return to the ED for new or worsening symptoms.  Patient agrees with plan.      ____________________________________________   FINAL CLINICAL IMPRESSION(S) / ED DIAGNOSES  Final diagnoses:  Closed nondisplaced fracture of distal phalanx of right great toe, initial encounter     ED Discharge Orders    None       Note:  This document was prepared using Dragon voice recognition software and may include unintentional dictation errors.   Chesley Noon, MD 10/21/19 [phone]

## 2020-07-30 ENCOUNTER — Emergency Department: Payer: Self-pay

## 2020-07-30 ENCOUNTER — Encounter: Payer: Self-pay | Admitting: Emergency Medicine

## 2020-07-30 ENCOUNTER — Emergency Department
Admission: EM | Admit: 2020-07-30 | Discharge: 2020-07-30 | Disposition: A | Discharge status: Home / Self Care | Payer: Self-pay | Attending: Emergency Medicine | Admitting: Emergency Medicine

## 2020-07-30 ENCOUNTER — Other Ambulatory Visit: Payer: Self-pay

## 2020-07-30 DIAGNOSIS — R519 Headache, unspecified: Secondary | ICD-10-CM

## 2020-07-30 DIAGNOSIS — E119 Type 2 diabetes mellitus without complications: Secondary | ICD-10-CM | POA: Insufficient documentation

## 2020-07-30 DIAGNOSIS — H10022 Other mucopurulent conjunctivitis, left eye: Secondary | ICD-10-CM | POA: Insufficient documentation

## 2020-07-30 DIAGNOSIS — J0100 Acute maxillary sinusitis, unspecified: Secondary | ICD-10-CM | POA: Insufficient documentation

## 2020-07-30 LAB — CBC WITH DIFFERENTIAL/PLATELET
Abs Immature Granulocytes: 0.02 10*3/uL (ref 0.00–0.07)
Basophils Absolute: 0 10*3/uL (ref 0.0–0.1)
Basophils Relative: 0 %
Eosinophils Absolute: 0 10*3/uL (ref 0.0–0.5)
Eosinophils Relative: 1 %
HCT: 36.6 % (ref 36.0–46.0)
Hemoglobin: 11.4 g/dL — ABNORMAL LOW (ref 12.0–15.0)
Immature Granulocytes: 0 %
Lymphocytes Relative: 30 %
Lymphs Abs: 2.2 10*3/uL (ref 0.7–4.0)
MCH: 22.2 pg — ABNORMAL LOW (ref 26.0–34.0)
MCHC: 31.1 g/dL (ref 30.0–36.0)
MCV: 71.2 fL — ABNORMAL LOW (ref 80.0–100.0)
Monocytes Absolute: 0.5 10*3/uL (ref 0.1–1.0)
Monocytes Relative: 7 %
Neutro Abs: 4.5 10*3/uL (ref 1.7–7.7)
Neutrophils Relative %: 62 %
Platelets: 370 10*3/uL (ref 150–400)
RBC: 5.14 MIL/uL — ABNORMAL HIGH (ref 3.87–5.11)
RDW: 15.6 % — ABNORMAL HIGH (ref 11.5–15.5)
WBC: 7.2 10*3/uL (ref 4.0–10.5)
nRBC: 0 % (ref 0.0–0.2)

## 2020-07-30 LAB — BASIC METABOLIC PANEL
Anion gap: 8 (ref 5–15)
BUN: 10 mg/dL (ref 6–20)
CO2: 25 mmol/L (ref 22–32)
Calcium: 9 mg/dL (ref 8.9–10.3)
Chloride: 101 mmol/L (ref 98–111)
Creatinine, Ser: 0.48 mg/dL (ref 0.44–1.00)
GFR, Estimated: >60 mL/min (ref >60)
Glucose, Bld: 279 mg/dL — ABNORMAL HIGH (ref 70–99)
Potassium: 4.2 mmol/L (ref 3.5–5.1)
Sodium: 134 mmol/L — ABNORMAL LOW (ref 135–145)

## 2020-07-30 MED ORDER — TETRACAINE HCL 0.5 % OP SOLN
2.0000 [drp] | Freq: Once | OPHTHALMIC | Status: AC
  Administered 2020-07-30: 2 [drp] via OPHTHALMIC
  Filled 2020-07-30: qty 4

## 2020-07-30 MED ORDER — NAPROXEN 500 MG PO TABS: 500.0000 mg | ORAL_TABLET | Freq: Two times a day (BID) | ORAL | Status: AC

## 2020-07-30 MED ORDER — FLUORESCEIN SODIUM 1 MG OP STRP
1.0000 | ORAL_STRIP | Freq: Once | OPHTHALMIC | Status: AC
  Administered 2020-07-30: 1 via OPHTHALMIC
  Filled 2020-07-30: qty 1

## 2020-07-30 MED ORDER — FEXOFENADINE-PSEUDOEPHED ER 60-120 MG PO TB12: 1.0000 | ORAL_TABLET | Freq: Two times a day (BID) | ORAL | 0 refills | Status: AC

## 2020-07-30 MED ORDER — SULFACETAMIDE SODIUM 10 % OP SOLN: 2.0000 [drp] | Freq: Four times a day (QID) | OPHTHALMIC | 0 refills | Status: AC

## 2020-07-30 MED ORDER — KETOROLAC TROMETHAMINE 30 MG/ML IJ SOLN
30.0000 mg | Freq: Once | INTRAMUSCULAR | Status: AC
  Administered 2020-07-30: 30 mg via INTRAVENOUS
  Filled 2020-07-30: qty 1

## 2020-07-30 MED ORDER — HYDROMORPHONE HCL 1 MG/ML IJ SOLN
1.0000 mg | Freq: Once | INTRAMUSCULAR | Status: AC
Start: 2020-07-30 — End: 2020-07-30
  Administered 2020-07-30: 1 mg via INTRAVENOUS
  Filled 2020-07-30: qty 1

## 2020-07-30 MED ORDER — EYE WASH OPHTH SOLN
1.0000 [drp] | OPHTHALMIC | Status: DC | PRN
  Filled 2020-07-30: qty 118

## 2020-07-30 MED ORDER — AMOXICILLIN 875 MG PO TABS: 875.0000 mg | ORAL_TABLET | Freq: Two times a day (BID) | ORAL | 0 refills | Status: AC

## 2020-07-30 MED ORDER — ONDANSETRON HCL 4 MG/2ML IJ SOLN
4.0000 mg | Freq: Once | INTRAMUSCULAR | Status: AC
  Administered 2020-07-30: 4 mg via INTRAVENOUS
  Filled 2020-07-30: qty 2

## 2020-07-30 NOTE — Discharge Instructions (Addendum)
Read and follow discharge care instructions.  Take medication as directed. 

## 2020-07-30 NOTE — ED Provider Notes (Signed)
[facility] Emergency Department Provider Note   ____________________________________________   Event Date/Time   First MD Initiated Contact with Patient 07/30/20 1055     (approximate)  I have reviewed the triage vital signs and the nursing notes.   HISTORY  Chief Complaint Eye Problem    HPI Bethany Ramirez  Bethany Ramirez is a 40 y.o. female patient complains of 3 days of left swelling and redness.  Patient states she has become increasingly photophobic and has a headache on the left side of her face.  Patient denies provocative incident for complaint.  Patient also states there is drainage from the left eye.         Past Medical History:  Diagnosis Date  . Diabetes mellitus without complication [facility])     Patient Active Problem List   Diagnosis Date Noted  . Overweight 10/21/2017  . Elevated LFTs 05/01/2017    Past Surgical History:  Procedure Laterality Date  . CESAREAN SECTION      Prior to Admission medications   Medication Sig Start Date End Date Taking? Authorizing Provider  amoxicillin (AMOXIL) 875 MG tablet Take 1 tablet (875 mg total) by mouth 2 (two) times daily. 07/30/20  Yes Joni Reining, PA-C  fexofenadine-pseudoephedrine (ALLEGRA-D) 60-120 MG 12 hr tablet Take 1 tablet by mouth 2 (two) times daily. 07/30/20  Yes Joni Reining, PA-C  naproxen (NAPROSYN) 500 MG tablet Take 1 tablet (500 mg total) by mouth 2 (two) times daily with a meal. 07/30/20  Yes Joni Reining, PA-C  sulfacetamide (BLEPH-10) 10 % ophthalmic solution Place 2 drops into the left eye 4 (four) times daily for 10 days. 07/30/20 08/09/20 Yes Joni Reining, PA-C  acetaminophen (TYLENOL) 500 MG tablet Take 500 mg by mouth every 6 (six) hours as needed. PRN    [provider]  clotrimazole (CLOTRIMAZOLE AF) 1 % cream Apply 1 application topically 2 (two) times daily. 04/06/19   Staples, Hulda Humphrey, PA-C    Allergies Patient has no known allergies.  No family  history on file.  Social History Social History   Tobacco Use  . Smoking status: Never Smoker  . Smokeless tobacco: Never Used  Vaping Use  . Vaping Use: Never used  Substance Use Topics  . Alcohol use: Not Currently  . Drug use: Not Currently    Review of Systems Constitutional: No fever/chills Eyes: Photophobic and left eye pain.  Drainage from left eye.   ENT: No sore throat. Cardiovascular: Denies chest pain. Respiratory: Denies shortness of breath. Gastrointestinal: No abdominal pain.  No nausea, no vomiting.  No diarrhea.  No constipation. Genitourinary: Negative for dysuria. Musculoskeletal: Negative for back pain. Skin: Negative for rash. Neurological: Positive for headaches, but denies focal weakness or numbness.   ____________________________________________   PHYSICAL EXAM:  VITAL SIGNS: ED Triage Vitals  Enc Vitals Group     BP 07/30/20 1045 120/89     Pulse Rate 07/30/20 1045 (!) 103     Resp 07/30/20 1045 18     Temp 07/30/20 1045 98.6 F (37 C)     Temp Source 07/30/20 1045 Oral     SpO2 07/30/20 1045 100 %     Weight 07/30/20 1051 138 lb (62.6 kg)     Height 07/30/20 1051 5\' 2"  (1.575 m)     Head Circumference --      Peak Flow --      Pain Score 07/30/20 1050 7     Pain Loc --  Pain Edu? --      Excl. in GC? --     Constitutional: Alert and oriented. Well appearing and in no acute distress. Eyes: Photophobic involving the left eye.  Conjunctiva is erythematous.  Dry yellowish-greenish discharge left eye.   PERRL. EOMI. Head: Atraumatic. Nose: No congestion/rhinnorhea.  Bilateral maxillary guarding. Mouth/Throat: Mucous membranes are moist.  Oropharynx non-erythematous. Neck: No stridor.   Hematological/Lymphatic/Immunilogical: No cervical lymphadenopathy. Cardiovascular: Normal rate, regular rhythm. Grossly normal heart sounds.  Good peripheral circulation. Respiratory: Normal respiratory effort.  No retractions. Lungs  CTAB. Neurologic:  Normal speech and language. No gross focal neurologic deficits are appreciated. No gait instability. Skin:  Skin is warm, dry and intact. No rash noted.  Left inferior periorbital edema. Psychiatric: Mood and affect are normal. Speech and behavior are normal.  ____________________________________________   LABS (all labs ordered are listed, but only abnormal results are displayed)  Labs Reviewed  BASIC METABOLIC PANEL - Abnormal; Notable for the following components:      Result Value   Sodium 134 (*)    Glucose, Bld 279 (*)    All other components within normal limits  CBC WITH DIFFERENTIAL/PLATELET - Abnormal; Notable for the following components:   RBC 5.14 (*)    Hemoglobin 11.4 (*)    MCV 71.2 (*)    MCH 22.2 (*)    RDW 15.6 (*)    All other components within normal limits   ____________________________________________  EKG   ____________________________________________  RADIOLOGY I, Joni Reining, personally viewed and evaluated these images (plain radiographs) as part of my medical decision making, as well as reviewing the written report by the radiologist.  ED MD interpretation:    Official radiology report(s): CT Maxillofacial Wo Contrast  Result Date: 07/30/2020 CLINICAL DATA:  LEFT eye pain, maxillofacial abscess, inferior orbital edema and erythema, redness and swelling to LEFT thigh since Wednesday, headache beginning today, discharged from my EXAM: CT MAXILLOFACIAL WITHOUT CONTRAST TECHNIQUE: Multidetector CT imaging of the maxillofacial structures was performed. Multiplanar CT image reconstructions were also generated. Right side of face marked with BB. COMPARISON:  None FINDINGS: Osseous: Calvaria intact. Nasal septal deviation to the LEFT. Facial bones intact. TMJ alignment normal bilaterally. Visualized cervical spine unremarkable. Orbits: Intraorbital soft tissue planes clear. No orbital fluid or pneumatosis. Bony orbital walls intact. Two  tiny foci of gas are seen extra orbital adjacent to lateral margin of the optic globe anteriorly, nonspecific, may be under eye lid. Sinuses: Mucosal thickening throughout RIGHT maxillary sinus and a few ethmoid air cells. Remaining paranasal sinuses, mastoid air cells, and middle ear cavities clear. Soft tissues: Facial soft tissues unremarkable Limited intracranial: Visualized intracranial structures unremarkable. IMPRESSION: No acute facial abnormalities. RIGHT maxillary sinus disease changes. Electronically Signed   By: Ulyses Southward M.D.   On: 07/30/2020 13:35    ____________________________________________   PROCEDURES  Procedure(s) performed (including Critical Care):  Procedures   ____________________________________________   INITIAL IMPRESSION / ASSESSMENT AND PLAN / ED COURSE  As part of my medical decision making, I reviewed the following data within the electronic MEDICAL RECORD NUMBER        Patient presents with 3 days of redness and swelling to the left thigh.  Patient also complain of sinus congestion and facial pain.  Patient states today with severe headache and photophobia.  Patient states drainage from the left eye.  Discussed CT findings with patient consistent with sinusitis.  Patient complaint physical exam is consistent with sinusitis, sinus headache, and conjunctivitis  of the left eye.  Patient given discharge care instruction.  Patient advised take medication as directed.  Patient given work note for 2 days.  Patient vies follow-up with the ENT clinic if no improvement in 1 week.  Return med ED if condition worsens.     ____________________________________________   FINAL CLINICAL IMPRESSION(S) / ED DIAGNOSES  Final diagnoses:  Acute maxillary sinusitis, recurrence not specified  Sinus headache  Other mucopurulent conjunctivitis of left eye     ED Discharge Orders         Ordered    amoxicillin (AMOXIL) 875 MG tablet  2 times daily        07/30/20 1412     fexofenadine-pseudoephedrine (ALLEGRA-D) 60-120 MG 12 hr tablet  2 times daily        07/30/20 1412    naproxen (NAPROSYN) 500 MG tablet  2 times daily with meals        07/30/20 1412    sulfacetamide (BLEPH-10) 10 % ophthalmic solution  4 times daily        07/30/20 1412           Note:  This document was prepared using Dragon voice recognition software and may include unintentional dictation errors.    Joni Reining, PA-C 07/30/20 1422    Dionne Bucy, MD 07/30/20 [phone]

## 2020-07-30 NOTE — ED Triage Notes (Signed)
Pt reports redness and swelling to left eye since Wednesday. Pt reports today started with a HA and now both eyes hurt. Pt reports discharge from eye

## 2020-07-30 NOTE — ED Notes (Signed)
Spanish video interpreter used to provide discharge instructions, name: [name], ID: [id number].

## 2020-07-30 NOTE — ED Notes (Signed)
Spanish video interpreter used, name: Shirlee Limerick, [place]: [id number]. Discussed plan of care with patient who verbalizes understanding and denies questions at this time.

## 2021-02-01 ENCOUNTER — Emergency Department: Payer: Self-pay

## 2021-02-01 ENCOUNTER — Ambulatory Visit (LOCAL_COMMUNITY_HEALTH_CENTER): Payer: Self-pay

## 2021-02-01 ENCOUNTER — Other Ambulatory Visit: Payer: Self-pay

## 2021-02-01 ENCOUNTER — Encounter: Payer: Self-pay | Admitting: Emergency Medicine

## 2021-02-01 VITALS — BP 135/81 | Wt 150.5 lb

## 2021-02-01 DIAGNOSIS — R58 Hemorrhage, not elsewhere classified: Secondary | ICD-10-CM

## 2021-02-01 DIAGNOSIS — Z3201 Encounter for pregnancy test, result positive: Secondary | ICD-10-CM

## 2021-02-01 DIAGNOSIS — O4690 Antepartum hemorrhage, unspecified, unspecified trimester: Secondary | ICD-10-CM

## 2021-02-01 DIAGNOSIS — E119 Type 2 diabetes mellitus without complications: Secondary | ICD-10-CM | POA: Insufficient documentation

## 2021-02-01 DIAGNOSIS — O021 Missed abortion: Secondary | ICD-10-CM | POA: Insufficient documentation

## 2021-02-01 LAB — CBC WITH DIFFERENTIAL/PLATELET
Abs Immature Granulocytes: 0.02 10*3/uL (ref 0.00–0.07)
Basophils Absolute: 0 10*3/uL (ref 0.0–0.1)
Basophils Relative: 0 %
Eosinophils Absolute: 0.1 10*3/uL (ref 0.0–0.5)
Eosinophils Relative: 1 %
HCT: 32.2 % — ABNORMAL LOW (ref 36.0–46.0)
Hemoglobin: 9.3 g/dL — ABNORMAL LOW (ref 12.0–15.0)
Immature Granulocytes: 0 %
Lymphocytes Relative: 36 %
Lymphs Abs: 2.5 10*3/uL (ref 0.7–4.0)
MCH: 18.8 pg — ABNORMAL LOW (ref 26.0–34.0)
MCHC: 28.9 g/dL — ABNORMAL LOW (ref 30.0–36.0)
MCV: 65.1 fL — ABNORMAL LOW (ref 80.0–100.0)
Monocytes Absolute: 0.6 10*3/uL (ref 0.1–1.0)
Monocytes Relative: 8 %
Neutro Abs: 3.7 10*3/uL (ref 1.7–7.7)
Neutrophils Relative %: 55 %
Platelets: 394 10*3/uL (ref 150–400)
RBC: 4.95 MIL/uL (ref 3.87–5.11)
RDW: 17.9 % — ABNORMAL HIGH (ref 11.5–15.5)
Smear Review: NORMAL
WBC: 6.8 10*3/uL (ref 4.0–10.5)
nRBC: 0 % (ref 0.0–0.2)

## 2021-02-01 LAB — BASIC METABOLIC PANEL
Anion gap: 7 (ref 5–15)
BUN: 11 mg/dL (ref 6–20)
CO2: 23 mmol/L (ref 22–32)
Calcium: 9.4 mg/dL (ref 8.9–10.3)
Chloride: 101 mmol/L (ref 98–111)
Creatinine, Ser: 0.41 mg/dL — ABNORMAL LOW (ref 0.44–1.00)
GFR, Estimated: >60 mL/min (ref >60)
Glucose, Bld: 311 mg/dL — ABNORMAL HIGH (ref 70–99)
Potassium: 3.8 mmol/L (ref 3.5–5.1)
Sodium: 131 mmol/L — ABNORMAL LOW (ref 135–145)

## 2021-02-01 LAB — HCG, QUANTITATIVE, PREGNANCY: hCG, Beta Chain, Quant, S: 13024 m[IU]/mL — ABNORMAL HIGH (ref <5)

## 2021-02-01 LAB — ABO/RH: ABO/RH(D): A POS

## 2021-02-01 LAB — PREGNANCY, URINE: Preg Test, Ur: POSITIVE — AB

## 2021-02-01 MED ORDER — PRENATAL 27-0.8 MG PO TABS: 1.0000 | ORAL_TABLET | Freq: Every day | ORAL | 0 refills | Status: AC

## 2021-02-01 NOTE — Progress Notes (Addendum)
UPT positive. Plans prenatal care at [facility]. Reports she will be considered high risk d/t diabetes. States she was dx with diabetes 2015. Pt explains she has appt [facility] 02/14/21 for diabetes f-u.  She says she stopped doing blood sugar checks and insulin injections 2 months ago because she had no job nor insurance. States she is working now. Consult E sciora, CNM who recommends pt to contact [facility] to establish earlier appt and to contact [facility] for prenatal care ASAP. Provider explains risk of congential anomalies with uncontrolled diabetes. RN discussed provider recommendations and pt in agreement. To DSS for medicaid/preg women. M [place], interpreter. Jerel Shepherd, RN  Consulted on the plan of care for this client.  I agree with the documented note and actions taken to provide care for this client.  Hazle Coca, CNM

## 2021-02-01 NOTE — ED Triage Notes (Signed)
Spanish interpreter used during triage. Pt in with complaints of vaginal bleeding that began 2 hrs ago. 8wks +6days pregnant, LMP 10/6. Pt states that when she wipes, she notices small amount of blood on tissue. Pt denies any pain or cramping.

## 2021-02-02 ENCOUNTER — Emergency Department
Admission: EM | Admit: 2021-02-02 | Discharge: 2021-02-02 | Disposition: A | Discharge status: Home / Self Care | Payer: Self-pay | Attending: Emergency Medicine | Admitting: Emergency Medicine

## 2021-02-02 DIAGNOSIS — O021 Missed abortion: Secondary | ICD-10-CM

## 2021-02-02 DIAGNOSIS — R58 Hemorrhage, not elsewhere classified: Secondary | ICD-10-CM

## 2021-02-02 DIAGNOSIS — O4690 Antepartum hemorrhage, unspecified, unspecified trimester: Secondary | ICD-10-CM

## 2021-02-02 NOTE — ED Provider Notes (Signed)
[facility] Emergency Department Provider Note  ____________________________________________  Time seen: Approximately 1:34 AM  I have reviewed the triage vital signs and the nursing notes.   HISTORY  Chief Complaint Vaginal Bleeding ([redacted] wks pregnant)   HPI Bethany Ramirez is a 40 y.o. female with history of diabetes who presents for evaluation of vaginal bleeding.  Patient's LMP was 8 weeks ago.  She had positive pregnancy test at home.  2 hours ago she noticed some spotting when she goes to the bathroom.  She denies any pain.  She has not established care for this pregnancy.  Patient has had 5 previous pregnancies and has 5 kids at home.  Past Medical History:  Diagnosis Date   Diabetes mellitus without complication [facility])     Patient Active Problem List   Diagnosis Date Noted   Overweight 10/21/2017   Elevated LFTs 05/01/2017    Past Surgical History:  Procedure Laterality Date   CESAREAN SECTION      Prior to Admission medications   Medication Sig Start Date End Date Taking? Authorizing Provider  acetaminophen (TYLENOL) 500 MG tablet Take 500 mg by mouth every 6 (six) hours as needed. PRN    [provider]  amoxicillin (AMOXIL) 875 MG tablet Take 1 tablet (875 mg total) by mouth 2 (two) times daily. Patient not taking: Reported on 02/01/2021 07/30/20   Joni Reining, PA-C  clotrimazole (CLOTRIMAZOLE AF) 1 % cream Apply 1 application topically 2 (two) times daily. Patient not taking: Reported on 02/01/2021 04/06/19   Samara Snide L, PA-C  fexofenadine-pseudoephedrine (ALLEGRA-D) 60-120 MG 12 hr tablet Take 1 tablet by mouth 2 (two) times daily. Patient not taking: Reported on 02/01/2021 07/30/20   Joni Reining, PA-C  naproxen (NAPROSYN) 500 MG tablet Take 1 tablet (500 mg total) by mouth 2 (two) times daily with a meal. Patient not taking: Reported on 02/01/2021 07/30/20   Joni Reining, PA-C  Prenatal Vit-Fe Fumarate-FA  (MULTIVITAMIN-PRENATAL) 27-0.8 MG TABS tablet Take 1 tablet by mouth daily at 12 noon. 02/01/21 05/12/21  Federico Flake, MD    Allergies Patient has no known allergies.  No family history on file.  Social History Social History   Tobacco Use   Smoking status: Never   Smokeless tobacco: Never  Vaping Use   Vaping Use: Never used  Substance Use Topics   Alcohol use: Not Currently    Comment: last use 5-6 mo ago- mixed drink   Drug use: Never    Review of Systems  Constitutional: Negative for fever. Eyes: Negative for visual changes. ENT: Negative for sore throat. Neck: No neck pain  Cardiovascular: Negative for chest pain. Respiratory: Negative for shortness of breath. Gastrointestinal: Negative for abdominal pain, vomiting or diarrhea. Genitourinary: Negative for dysuria. + vaginal bleeding Musculoskeletal: Negative for back pain. Skin: Negative for rash. Neurological: Negative for headaches, weakness or numbness. Psych: No SI or HI  ____________________________________________   PHYSICAL EXAM:  VITAL SIGNS: ED Triage Vitals [02/01/21 1909]  Enc Vitals Group     BP 125/87     Pulse Rate 98     Resp 18     Temp 98.3 F (36.8 C)     Temp Source Oral     SpO2 98 %     Weight      Height      Head Circumference      Peak Flow      Pain Score  Pain Loc      Pain Edu?      Excl. in GC?     Constitutional: Alert and oriented. Well appearing and in no apparent distress. HEENT:      Head: Normocephalic and atraumatic.         Eyes: Conjunctivae are normal. Sclera is non-icteric.       Mouth/Throat: Mucous membranes are moist.       Neck: Supple with no signs of meningismus. Cardiovascular: Regular rate and rhythm. No murmurs, gallops, or rubs. 2+ symmetrical distal pulses are present in all extremities. No JVD. Respiratory: Normal respiratory effort. Lungs are clear to auscultation bilaterally.  Gastrointestinal: Soft, non tender, and non  distended with positive bowel sounds. No rebound or guarding. Genitourinary: No CVA tenderness. Musculoskeletal:  No edema, cyanosis, or erythema of extremities. Neurologic: Normal speech and language. Face is symmetric. Moving all extremities. No gross focal neurologic deficits are appreciated. Skin: Skin is warm, dry and intact. No rash noted. Psychiatric: Mood and affect are normal. Speech and behavior are normal.  ____________________________________________   LABS (all labs ordered are listed, but only abnormal results are displayed)  Labs Reviewed  CBC WITH DIFFERENTIAL/PLATELET - Abnormal; Notable for the following components:      Result Value   Hemoglobin 9.3 (*)    HCT 32.2 (*)    MCV 65.1 (*)    MCH 18.8 (*)    MCHC 28.9 (*)    RDW 17.9 (*)    All other components within normal limits  BASIC METABOLIC PANEL - Abnormal; Notable for the following components:   Sodium 131 (*)    Glucose, Bld 311 (*)    Creatinine, Ser 0.41 (*)    All other components within normal limits  HCG, QUANTITATIVE, PREGNANCY - Abnormal; Notable for the following components:   hCG, Beta Chain, Quant, S 13,024 (*)    All other components within normal limits  ABO/RH   ____________________________________________  EKG  none  ____________________________________________  RADIOLOGY  I have personally reviewed the images performed during this visit and I agree with the Radiologist's read.   Interpretation by Radiologist:  US OB LESS THAN 14 WEEKS WITH OB TRANSVAGINAL  Result Date: 02/01/2021 CLINICAL DATA:  Vaginal bleeding. First trimester. History of C-section. Gestational age by last menstrual 8 weeks and 6 days. Last menstrual period 12/01/2020. Estimated due date 09/07/2021 EXAM: OBSTETRIC <14 WK [place] AND TRANSVAGINAL OB US TECHNIQUE: Both transabdominal and transvaginal ultrasound examinations were performed for complete evaluation of the gestation as well as the maternal uterus, adnexal  regions, and pelvic cul-de-sac. Transvaginal technique was performed to assess early pregnancy. COMPARISON:  None. FINDINGS: Intrauterine gestational sac: Single Yolk sac:  Visualized. Embryo:  Visualized. Cardiac Activity: Not Visualized. CRL: 7.3  mm   6 w   4 d                  [place] EDC: 09/23/21 Subchorionic hemorrhage:  None visualized. Maternal uterus/adnexae: Bilateral ovaries are unremarkable. The uterus is otherwise unremarkable. Other: No free fluid. IMPRESSION: Single intrauterine pregnancy with a crown-rump length of 7.3 mm with no associated cardiac activity is identified. Findings meet definitive criteria for failed pregnancy. This follows SRU consensus guidelines: Diagnostic Criteria for Nonviable Pregnancy Early in the First Trimester. [facility] Mis J Med [phone]. Electronically Signed   By: Tish Frederickson M.D.   On: 02/01/2021 20:45     ____________________________________________   PROCEDURES  Procedure(s) performed: None Procedures   Critical Care performed:  None ____________________________________________   INITIAL IMPRESSION / ASSESSMENT AND PLAN / ED COURSE  40 y.o. female with history of diabetes, currently [redacted] weeks GA based on LMP who presents for evaluation of vaginal bleeding. Patient reports spotting that started today. A+ with no indication for RhoGAM.  Beta quant of 13,000.  Ultrasound showing single IUP with no cardiac activity meeting definitive criteria for failed pregnancy.  Discussed with Dr. Bonney Aid from OB/GYN who recommended outpatient follow-up in the clinic in 24 to 48 hours for discussion of further management.  No intervention at this time.  Patient has mild spotting with no signs of heavy vaginal bleeding.  She is otherwise hemodynamically stable.  Mildly anemic on blood work.  Discussed findings of ultrasound and options with patient.  She was provided with the phone number of [facility] for close for evaluation.  Discussed my standard return  precautions      _____________________________________________ Please note:  Patient was evaluated in Emergency Department today for the symptoms described in the history of present illness. Patient was evaluated in the context of the global COVID-19 pandemic, which necessitated consideration that the patient might be at risk for infection with the SARS-CoV-2 virus that causes COVID-19. Institutional protocols and algorithms that pertain to the evaluation of patients at risk for COVID-19 are in a state of rapid change based on information released by regulatory bodies including the CDC and federal and state organizations. These policies and algorithms were followed during the patient's care in the ED.  Some ED evaluations and interventions may be delayed as a result of limited staffing during the pandemic.   [place] Controlled Substance Database was reviewed by me. ____________________________________________   FINAL CLINICAL IMPRESSION(S) / ED DIAGNOSES   Final diagnoses:  Bleeding  Missed abortion with fetal demise before 20 completed weeks of gestation      NEW MEDICATIONS STARTED DURING THIS VISIT:  ED Discharge Orders     None        Note:  This document was prepared using Dragon voice recognition software and may include unintentional dictation errors.    Nita Sickle, MD 02/02/21 [phone]

## 2021-02-06 ENCOUNTER — Ambulatory Visit: Payer: Self-pay | Admitting: Obstetrics & Gynecology

## 2021-02-08 ENCOUNTER — Ambulatory Visit: Payer: Self-pay
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
## 2022-12-20 ENCOUNTER — Encounter: Payer: Self-pay | Admitting: Emergency Medicine

## 2022-12-20 ENCOUNTER — Other Ambulatory Visit: Payer: Self-pay

## 2022-12-20 DIAGNOSIS — Y990 Civilian activity done for income or pay: Secondary | ICD-10-CM | POA: Insufficient documentation

## 2022-12-20 DIAGNOSIS — E119 Type 2 diabetes mellitus without complications: Secondary | ICD-10-CM | POA: Insufficient documentation

## 2022-12-20 DIAGNOSIS — S0993XA Unspecified injury of face, initial encounter: Secondary | ICD-10-CM | POA: Diagnosis present

## 2022-12-20 DIAGNOSIS — Z23 Encounter for immunization: Secondary | ICD-10-CM | POA: Insufficient documentation

## 2022-12-20 DIAGNOSIS — T2010XA Burn of first degree of head, face, and neck, unspecified site, initial encounter: Secondary | ICD-10-CM | POA: Diagnosis not present

## 2022-12-20 DIAGNOSIS — T2111XA Burn of first degree of chest wall, initial encounter: Secondary | ICD-10-CM | POA: Insufficient documentation

## 2022-12-20 DIAGNOSIS — Z79899 Other long term (current) drug therapy: Secondary | ICD-10-CM | POA: Insufficient documentation

## 2022-12-20 DIAGNOSIS — X118XXA Contact with other hot tap-water, initial encounter: Secondary | ICD-10-CM | POA: Diagnosis not present
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
## 2022-12-21 ENCOUNTER — Emergency Department
Admission: EM | Admit: 2022-12-21 | Discharge: 2022-12-21 | Disposition: A | Discharge status: Home / Self Care | Payer: Worker's Compensation | Attending: Emergency Medicine | Admitting: Emergency Medicine

## 2022-12-21 DIAGNOSIS — T300 Burn of unspecified body region, unspecified degree: Secondary | ICD-10-CM

## 2022-12-21 LAB — URINE DRUG SCREEN, QUALITATIVE (ARMC ONLY)
Amphetamines, Ur Screen: NOT DETECTED
Barbiturates, Ur Screen: NOT DETECTED
Benzodiazepine, Ur Scrn: NOT DETECTED
Cannabinoid 50 Ng, Ur ~~LOC~~: NOT DETECTED
Cocaine Metabolite,Ur ~~LOC~~: NOT DETECTED
MDMA (Ecstasy)Ur Screen: NOT DETECTED
Methadone Scn, Ur: NOT DETECTED
Opiate, Ur Screen: NOT DETECTED
Phencyclidine (PCP) Ur S: NOT DETECTED
Tricyclic, Ur Screen: NOT DETECTED

## 2022-12-21 MED ORDER — BACITRACIN ZINC 500 UNIT/GM EX OINT
TOPICAL_OINTMENT | Freq: Once | CUTANEOUS | Status: AC
  Administered 2022-12-21: 1 via TOPICAL
  Filled 2022-12-21: qty 0.9

## 2022-12-21 MED ORDER — SILVER SULFADIAZINE 1 % EX CREA
TOPICAL_CREAM | Freq: Once | CUTANEOUS | Status: AC
  Administered 2022-12-21: 1 via TOPICAL
  Filled 2022-12-21: qty 20

## 2022-12-21 MED ORDER — SILVER SULFADIAZINE 1 % EX CREA: TOPICAL_CREAM | CUTANEOUS | 1 refills | Status: AC

## 2022-12-21 MED ORDER — TETANUS-DIPHTH-ACELL PERTUSSIS 5-2.5-18.5 LF-MCG/0.5 IM SUSY
0.5000 mL | PREFILLED_SYRINGE | Freq: Once | INTRAMUSCULAR | Status: AC
  Administered 2022-12-21: 0.5 mL via INTRAMUSCULAR
  Filled 2022-12-21: qty 0.5

## 2022-12-21 MED ORDER — IBUPROFEN 600 MG PO TABS: 600.0000 mg | ORAL_TABLET | Freq: Three times a day (TID) | ORAL | 0 refills | Status: AC | PRN

## 2022-12-21 MED ORDER — HYDROCODONE-ACETAMINOPHEN 5-325 MG PO TABS
1.0000 | ORAL_TABLET | Freq: Once | ORAL | Status: AC
  Administered 2022-12-21: 1 via ORAL
  Filled 2022-12-21: qty 1

## 2022-12-21 MED ORDER — HYDROCODONE-ACETAMINOPHEN 5-325 MG PO TABS: 1.0000 | ORAL_TABLET | Freq: Four times a day (QID) | ORAL | 0 refills | Status: AC | PRN

## 2022-12-21 MED ORDER — IBUPROFEN 600 MG PO TABS
600.0000 mg | ORAL_TABLET | Freq: Once | ORAL | Status: AC
  Administered 2022-12-21: 600 mg via ORAL
  Filled 2022-12-21: qty 1
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
## 2022-12-21 NOTE — ED Notes (Signed)
3 workers comp drug screen swabs attempted without success. Pt not producing enough saliva to complete swab. Pt supervisor who is present with her informed as well. UDS ordered.
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
## 2022-12-21 NOTE — ED Notes (Signed)
Patient discharged at this time. Ambulated to lobby with independent and steady gait. Breathing unlabored speaking in full sentences. Verbalized understanding of all discharge, follow up, and medication teaching. Discharged homed with all belongings.   

## 2023-11-29 ENCOUNTER — Other Ambulatory Visit: Payer: Self-pay | Admitting: Family Medicine

## 2023-11-29 DIAGNOSIS — Z1231 Encounter for screening mammogram for malignant neoplasm of breast: Secondary | ICD-10-CM
